# Patient Record
Sex: Male | Born: 1975 | Race: White | Hispanic: No | Marital: Single | Smoking: Never smoker
Health system: Southern US, Community
[De-identification: ages and names within clinical notes are randomized; demographics above are authoritative.]

## PROBLEM LIST (undated history)

## (undated) DIAGNOSIS — F419 Anxiety disorder, unspecified: Secondary | ICD-10-CM

---

## 2008-05-18 ENCOUNTER — Emergency Department (HOSPITAL_COMMUNITY): Admission: EM | Admit: 2008-05-18 | Discharge: 2008-05-18 | Payer: Self-pay | Admitting: Emergency Medicine

## 2008-05-21 ENCOUNTER — Emergency Department (HOSPITAL_COMMUNITY): Admission: EM | Admit: 2008-05-21 | Discharge: 2008-05-21 | Payer: Self-pay | Admitting: Emergency Medicine

## 2011-07-29 LAB — CULTURE, ROUTINE-ABSCESS

## 2016-02-09 ENCOUNTER — Other Ambulatory Visit (HOSPITAL_COMMUNITY): Payer: Self-pay | Admitting: Family

## 2016-02-09 DIAGNOSIS — R609 Edema, unspecified: Secondary | ICD-10-CM

## 2016-02-09 DIAGNOSIS — R52 Pain, unspecified: Secondary | ICD-10-CM

## 2016-02-10 ENCOUNTER — Ambulatory Visit (HOSPITAL_COMMUNITY)
Admission: RE | Admit: 2016-02-10 | Discharge: 2016-02-10 | Disposition: A | Discharge status: Home / Self Care | Payer: Self-pay | Source: Ambulatory Visit | Attending: Family | Admitting: Family

## 2016-02-10 ENCOUNTER — Ambulatory Visit (HOSPITAL_COMMUNITY): Admission: RE | Admit: 2016-02-10 | Payer: Self-pay | Source: Ambulatory Visit

## 2016-02-10 DIAGNOSIS — R938 Abnormal findings on diagnostic imaging of other specified body structures: Secondary | ICD-10-CM | POA: Insufficient documentation

## 2016-02-10 DIAGNOSIS — R52 Pain, unspecified: Secondary | ICD-10-CM

## 2016-02-10 DIAGNOSIS — N433 Hydrocele, unspecified: Secondary | ICD-10-CM | POA: Insufficient documentation

## 2016-02-10 DIAGNOSIS — N50812 Left testicular pain: Secondary | ICD-10-CM | POA: Insufficient documentation

## 2016-02-10 DIAGNOSIS — R609 Edema, unspecified: Secondary | ICD-10-CM

## 2016-03-16 ENCOUNTER — Ambulatory Visit (INDEPENDENT_AMBULATORY_CARE_PROVIDER_SITE_OTHER): Payer: Self-pay | Admitting: Urology

## 2016-03-16 DIAGNOSIS — N433 Hydrocele, unspecified: Secondary | ICD-10-CM

## 2016-03-17 ENCOUNTER — Other Ambulatory Visit: Payer: Self-pay

## 2016-03-17 DIAGNOSIS — N433 Hydrocele, unspecified: Secondary | ICD-10-CM

## 2016-03-18 ENCOUNTER — Telehealth: Payer: Self-pay | Admitting: Urology

## 2016-03-18 NOTE — Telephone Encounter (Signed)
Pt advised of pre op date/time and sx date. Pre op: 04/08/16 @ 9:00am. Sx: 04/13/16 12-1:00 pm--with Dr Ronne BinningMcKenzie @ [facility]. Post op: 04/20/16 @ 3:15pm.  No authorization--patient is self pay.

## 2016-04-08 ENCOUNTER — Encounter (HOSPITAL_COMMUNITY)
Admission: RE | Admit: 2016-04-08 | Discharge: 2016-04-08 | Disposition: A | Discharge status: Home / Self Care | Payer: Self-pay | Source: Ambulatory Visit | Attending: Urology | Admitting: Urology

## 2016-04-08 ENCOUNTER — Encounter (HOSPITAL_COMMUNITY): Payer: Self-pay

## 2016-04-08 DIAGNOSIS — Z01810 Encounter for preprocedural cardiovascular examination: Secondary | ICD-10-CM | POA: Insufficient documentation

## 2016-04-08 DIAGNOSIS — Z01812 Encounter for preprocedural laboratory examination: Secondary | ICD-10-CM | POA: Insufficient documentation

## 2016-04-08 HISTORY — DX: Anxiety disorder, unspecified: F41.9

## 2016-04-08 LAB — SURGICAL PCR SCREEN
MRSA, PCR: NEGATIVE
Staphylococcus aureus: NEGATIVE

## 2016-04-08 LAB — CBC WITH DIFFERENTIAL/PLATELET
BASOS ABS: 0 10*3/uL (ref 0.0–0.1)
Basophils Relative: 1 %
EOS ABS: 0.3 10*3/uL (ref 0.0–0.7)
EOS PCT: 4 %
HCT: 42.9 % (ref 39.0–52.0)
Hemoglobin: 14.2 g/dL (ref 13.0–17.0)
Lymphocytes Relative: 21 %
Lymphs Abs: 1.7 10*3/uL (ref 0.7–4.0)
MCH: 29.3 pg (ref 26.0–34.0)
MCHC: 33.1 g/dL (ref 30.0–36.0)
MCV: 88.6 fL (ref 78.0–100.0)
Monocytes Absolute: 0.6 10*3/uL (ref 0.1–1.0)
Monocytes Relative: 7 %
Neutro Abs: 5.5 10*3/uL (ref 1.7–7.7)
Neutrophils Relative %: 67 %
PLATELETS: 366 10*3/uL (ref 150–400)
RBC: 4.84 MIL/uL (ref 4.22–5.81)
RDW: 12.5 % (ref 11.5–15.5)
WBC: 8.2 10*3/uL (ref 4.0–10.5)

## 2016-04-08 NOTE — Pre-Procedure Instructions (Signed)
Patient given information to sign up for my chart at home. 

## 2016-04-08 NOTE — Patient Instructions (Signed)
[facility]  04/08/2016     @    Your procedure is scheduled on:  04/13/2016  Report to Day Surgery at 1000 AM.                        Remember:             Do not eat food or drink liquids after midnight.   Take these medicines the morning of surgery with A SIP OF WATER: none  Do not wear jewelry, make-up or nail polish.  Do not wear lotions, powders, or perfumes. You may wear deodorant.  Do not shave 48 hours prior to surgery. Men may shave face and neck.  Do not bring valuables to the hospital.               [facility] is not responsible for any belongings or valuables.               Contacts, dentures or bridgework may not be worn into surgery.  Leave suitcase in the car. After surgery it may be brought to your room.                          For patients admitted to the hospital, discharge time              is determined by your treatment team.               Patients discharged the day of surgery will not be allowed to drive home.               Name and phone number of your driver: Mom  Special Instructions: None   Please read over the following fact sheets that you were given:              Hydrocelectomy Hydrocelectomy is a procedure to drain a hydrocele, which is a sac where fluid has built up around the testicles. The procedure may be done if a hydrocele is causing painful swelling in the pouch that holds the testicles (scrotum). LET [facility] CARE PROVIDER KNOW ABOUT:  Any allergies you have.  All medicines you are taking, including vitamins, herbs, eye drops, creams, and over-the-counter medicines.  Previous problems you or members of your family have had with the use of anesthetics.  Any blood disorders you have.  Previous surgeries you have had.  Any medical conditions you have. RISKS AND COMPLICATIONS Generally this is a safe procedure. However, problems may occur,  including:  Bleeding.  Bleeding into your scrotum (scrotal hematoma).  Damage to your testicle or the tube that carries sperm out of your testicle (vas deferens).  Infection.  Allergic reactions to medicines. BEFORE THE PROCEDURE  Ask your health care provider about:  Changing or stopping your regular medicines. This is especially important if you are taking diabetes medicines or blood thinners.  Taking medicines such as aspirin and ibuprofen. These medicines can thin your blood. Do not take these medicines before your procedure if your health care provider instructs you not to.  Follow instructions from your health care provider about eating or drinking restrictions.  Plan to have someone take you home after the procedure.  Ask your health care provider how your surgical site will be marked or identified.  You may be given antibiotic  medicine to help prevent infection. PROCEDURE  To reduce your risk of infection:  Your health care team will wash or sanitize their hands.  Your scrotum and the area around it will be shaved and washed with soap.  An IV tube will be inserted into one of your veins.  You will be given one or more of the following:  A medicine to make you relax (sedative).  A medicine to make you fall asleep (general anesthetic).  A small cut (incision) will be made through the skin of your scrotum.  Your testicle and the hydrocele will be located, and the hydrocele sac will be opened with an incision.  The fluid will be drained from the sac.  The sac will be closed with absorbable stitches (sutures) to prevent fluid from building up again.  If you have a large hydrocele, a thin rubber drain may be placed to allow fluid to drain after the procedure.  The incision in your scrotum will be closed with absorbable sutures.  A bandage (dressing) will be placed on your scrotum. The procedure may vary among health care providers and hospitals. AFTER THE  PROCEDURE  Your blood pressure, heart rate, breathing rate, and blood oxygen level will be monitored often until the medicines you were given have worn off.  You will be given pain medicine as needed.  Do not drive for 24 hours if you received a sedative.  You may have to wear an athletic support strap to hold the dressing in place and support your scrotum.   This information is not intended to replace advice given to you by your health care provider. Make sure you discuss any questions you have with your health care provider.   Document Released: 07/08/2015 Document Reviewed: 04/15/2015 Elsevier Interactive Patient Education 2016 [employer]. Hydrocelectomy, Care After Refer to this sheet in the next few weeks. These instructions provide you with information about caring for yourself after your procedure. Your health care provider may also give you more specific instructions. Your treatment has been planned according to current medical practices, but problems sometimes occur. Call your health care provider if you have any problems or questions after your procedure. WHAT TO EXPECT AFTER THE PROCEDURE After your procedure, it is common for the pouch that holds your testicles (scrotum) to be painful, swollen, and bruised. HOME CARE INSTRUCTIONS Bathing  Ask your health care provider when you can shower, take baths, or go swimming.  If you were told to wear an athletic support strap, take it off when you shower or take a bath. Incision Care  Follow instructions from your health care provider about how to take care of your incision. Make sure you:  Wash your hands with soap and water before you change your bandage (dressing). If soap and water are not available, use hand sanitizer.  Change your dressing as told by your health care provider.  Leave stitches (sutures) in place.  Check your incision and scrotum every day for signs of infection. Check for:  More redness, swelling, or  pain.  Blood or fluid.  Warmth.  Pus or a bad smell. Managing Pain, Stiffness, and Swelling  If directed, apply ice to the injured area:  Put ice in a plastic bag.  Place a towel between your skin and the bag.  Leave the ice on for 20 minutes, 2-3 times per day. Driving  Do not drive for 24 hours if you received a sedative.  Do not drive or operate heavy machinery while  taking prescription pain medicine.  Ask your health care provider when it is safe to drive. Activity  Do not do any activities that require great strength and energy (are vigorous) for as long as told by your health care provider.  Return to your normal activities as told by your health care provider. Ask your health care provider what activities are safe for you.  Do not lift anything that is heavier than 10 lb (4.5 kg) until your health care provider says that it is safe. General Instructions  Take over-the-counter and prescription medicines only as told by your health care provider.  Keep all follow-up visits as told by your health care provider. This is important.  If you were given an athletic support strap, wear it as told by your health care provider.  If you had a drain put in during the procedure, you will need to return to have it removed. SEEK MEDICAL CARE IF:  Your pain gets worse.  You have more redness, swelling, or pain around your scrotum.  You have blood or fluid coming from your scrotum.  Your incision feels warm to the touch.  You have pus or a bad smell coming from your scrotum.  You have a fever.   This information is not intended to replace advice given to you by your health care provider. Make sure you discuss any questions you have with your health care provider.   Document Released: 07/08/2015 Document Reviewed: 04/15/2015 Elsevier Interactive Patient Education 2016 [employer] General Anesthesia, Adult, Care After Refer to this sheet in the next few weeks. These  instructions provide you with information on caring for yourself after your procedure. Your health care provider may also give you more specific instructions. Your treatment has been planned according to current medical practices, but problems sometimes occur. Call your health care provider if you have any problems or questions after your procedure. WHAT TO EXPECT AFTER THE PROCEDURE After the procedure, it is typical to experience:  Sleepiness.  Nausea and vomiting. HOME CARE INSTRUCTIONS  For the first 24 hours after general anesthesia:  Have a responsible person with you.  Do not drive a car. If you are alone, do not take public transportation.  Do not drink alcohol.  Do not take medicine that has not been prescribed by your health care provider.  Do not sign important papers or make important decisions.  You may resume a normal diet and activities as directed by your health care provider.  Change bandages (dressings) as directed.  If you have questions or problems that seem related to general anesthesia, call the hospital and ask for the anesthetist or anesthesiologist on call. SEEK MEDICAL CARE IF:  You have nausea and vomiting that continue the day after anesthesia.  You develop a rash. SEEK IMMEDIATE MEDICAL CARE IF:   You have difficulty breathing.  You have chest pain.  You have any allergic problems.   This information is not intended to replace advice given to you by your health care provider. Make sure you discuss any questions you have with your health care provider.   Document Released: 01/23/2001 Document Revised: 11/07/2014 Document Reviewed: 02/15/2012 Elsevier Interactive Patient Education 2016 [employer] General Anesthesia, Adult General anesthesia is a sleep-like state of non-feeling produced by medicines (anesthetics). General anesthesia prevents you from being alert and feeling pain during a medical procedure. Your caregiver may recommend general  anesthesia if your procedure:  Is long.  Is painful or uncomfortable.  Would be frightening  to see or hear.  Requires you to be still.  Affects your breathing.  Causes significant blood loss. LET YOUR CAREGIVER KNOW ABOUT:  Allergies to food or medicine.  Medicines taken, including vitamins, herbs, eyedrops, over-the-counter medicines, and creams.  Use of steroids (by mouth or creams).  Previous problems with anesthetics or numbing medicines, including problems experienced by relatives.  History of bleeding problems or blood clots.  Previous surgeries and types of anesthetics received.  Possibility of pregnancy, if this applies.  Use of cigarettes, alcohol, or illegal drugs.  Any health condition(s), especially diabetes, sleep apnea, and high blood pressure. RISKS AND COMPLICATIONS General anesthesia rarely causes complications. However, if complications do occur, they can be life threatening. Complications include:  A lung infection.  A stroke.  A heart attack.  Waking up during the procedure. When this occurs, the patient may be unable to move and communicate that he or she is awake. The patient may feel severe pain. Older adults and adults with serious medical problems are more likely to have complications than adults who are young and healthy. Some complications can be prevented by answering all of your caregiver's questions thoroughly and by following all pre-procedure instructions. It is important to tell your caregiver if any of the pre-procedure instructions, especially those related to diet, were not followed. Any food or liquid in the stomach can cause problems when you are under general anesthesia. BEFORE THE PROCEDURE  Ask your caregiver if you will have to spend the night at the hospital. If you will not have to spend the night, arrange to have an adult drive you and stay with you for 24 hours.  Follow your caregiver's instructions if you are taking dietary  supplements or medicines. Your caregiver may tell you to stop taking them or to reduce your dosage.  Do not smoke for as long as possible before your procedure. If possible, stop smoking 3-6 weeks before the procedure.  Do not take new dietary supplements or medicines within 1 week of your procedure unless your caregiver approves them.  Do not eat within 8 hours of your procedure or as directed by your caregiver. Drink only clear liquids, such as water, black coffee (without milk or cream), and fruit juices (without pulp).  Do not drink within 3 hours of your procedure or as directed by your caregiver.  You may brush your teeth on the morning of the procedure, but make sure to spit out the toothpaste and water when finished. PROCEDURE  You will receive anesthetics through a mask, through an intravenous (IV) access tube, or through both. A doctor who specializes in anesthesia (anesthesiologist) or a nurse who specializes in anesthesia (nurse anesthetist) or both will stay with you throughout the procedure to make sure you remain unconscious. He or she will also watch your blood pressure, pulse, and oxygen levels to make sure that the anesthetics do not cause any problems. Once you are asleep, a breathing tube or mask may be used to help you breathe. AFTER THE PROCEDURE You will wake up after the procedure is complete. You may be in the room where the procedure was performed or in a recovery area. You may have a sore throat if a breathing tube was used. You may also feel:  Dizzy.  Weak.  Drowsy.  Confused.  Nauseous.  Cold. These are all normal responses and can be expected to last for up to 24 hours after the procedure is complete. A caregiver will tell you when you  are ready to go home. This will usually be when you are fully awake and in stable condition.   This information is not intended to replace advice given to you by your health care provider. Make sure you discuss any questions  you have with your health care provider.   Document Released: 01/24/2008 Document Revised: 11/07/2014 Document Reviewed: 02/15/2012 Elsevier Interactive Patient Education 2016 Elsevier Inc. PATIENT INSTRUCTIONS POST-ANESTHESIA  IMMEDIATELY FOLLOWING SURGERY:  Do not drive or operate machinery for the first twenty four hours after surgery.  Do not make any important decisions for twenty four hours after surgery or while taking narcotic pain medications or sedatives.  If you develop intractable nausea and vomiting or a severe headache please notify your doctor immediately.  FOLLOW-UP:  Please make an appointment with your surgeon as instructed. You do not need to follow up with anesthesia unless specifically instructed to do so.  WOUND CARE INSTRUCTIONS (if applicable):  Keep a dry clean dressing on the anesthesia/puncture wound site if there is drainage.  Once the wound has quit draining you may leave it open to air.  Generally you should leave the bandage intact for twenty four hours unless there is drainage.  If the epidural site drains for more than 36-48 hours please call the anesthesia department.  QUESTIONS?:  Please feel free to call your physician or the hospital operator if you have any questions, and they will be happy to assist you.

## 2016-04-13 ENCOUNTER — Ambulatory Visit (HOSPITAL_COMMUNITY): Payer: Self-pay | Admitting: Anesthesiology

## 2016-04-13 ENCOUNTER — Encounter (HOSPITAL_COMMUNITY)
Admission: RE | Disposition: A | Discharge status: Home / Self Care | Payer: Self-pay | Source: Ambulatory Visit | Attending: Urology

## 2016-04-13 ENCOUNTER — Ambulatory Visit (HOSPITAL_COMMUNITY)
Admission: RE | Admit: 2016-04-13 | Discharge: 2016-04-13 | Disposition: A | Discharge status: Home / Self Care | Payer: Self-pay | Source: Ambulatory Visit | Attending: Urology | Admitting: Urology

## 2016-04-13 ENCOUNTER — Encounter (HOSPITAL_COMMUNITY): Payer: Self-pay | Admitting: Anesthesiology

## 2016-04-13 DIAGNOSIS — N50812 Left testicular pain: Secondary | ICD-10-CM | POA: Insufficient documentation

## 2016-04-13 DIAGNOSIS — N431 Infected hydrocele: Secondary | ICD-10-CM

## 2016-04-13 DIAGNOSIS — N433 Hydrocele, unspecified: Secondary | ICD-10-CM | POA: Insufficient documentation

## 2016-04-13 DIAGNOSIS — F419 Anxiety disorder, unspecified: Secondary | ICD-10-CM | POA: Insufficient documentation

## 2016-04-13 HISTORY — PX: HYDROCELE EXCISION: SHX482

## 2016-04-13 SURGERY — HYDROCELECTOMY
Anesthesia: General | Laterality: Left

## 2016-04-13 MED ORDER — FENTANYL CITRATE (PF) 100 MCG/2ML IJ SOLN
INTRAMUSCULAR | Status: AC
  Filled 2016-04-13: qty 2

## 2016-04-13 MED ORDER — FENTANYL CITRATE (PF) 100 MCG/2ML IJ SOLN
25.0000 ug | INTRAMUSCULAR | Status: DC | PRN
  Administered 2016-04-13: 25 ug via INTRAVENOUS
  Administered 2016-04-13: 50 ug via INTRAVENOUS
  Administered 2016-04-13 (×3): 25 ug via INTRAVENOUS
  Filled 2016-04-13 (×2): qty 2

## 2016-04-13 MED ORDER — FENTANYL CITRATE (PF) 100 MCG/2ML IJ SOLN
INTRAMUSCULAR | Status: DC | PRN
  Administered 2016-04-13: 25 ug via INTRAVENOUS
  Administered 2016-04-13 (×3): 50 ug via INTRAVENOUS
  Administered 2016-04-13: 25 ug via INTRAVENOUS

## 2016-04-13 MED ORDER — PROPOFOL 10 MG/ML IV BOLUS
INTRAVENOUS | Status: DC | PRN
  Administered 2016-04-13: 50 mg via INTRAVENOUS
  Administered 2016-04-13: 200 mg via INTRAVENOUS

## 2016-04-13 MED ORDER — BUPIVACAINE HCL (PF) 0.25 % IJ SOLN
INTRAMUSCULAR | Status: DC | PRN
  Administered 2016-04-13: 10 mL

## 2016-04-13 MED ORDER — OXYCODONE-ACETAMINOPHEN 5-325 MG PO TABS: 1.0000 | ORAL_TABLET | ORAL | Status: AC | PRN

## 2016-04-13 MED ORDER — LIDOCAINE HCL (PF) 1 % IJ SOLN
INTRAMUSCULAR | Status: AC
  Filled 2016-04-13: qty 5

## 2016-04-13 MED ORDER — ONDANSETRON HCL 4 MG/2ML IJ SOLN
4.0000 mg | Freq: Once | INTRAMUSCULAR | Status: AC
  Administered 2016-04-13: 4 mg via INTRAVENOUS
  Filled 2016-04-13: qty 2

## 2016-04-13 MED ORDER — PROPOFOL 10 MG/ML IV BOLUS
INTRAVENOUS | Status: AC
  Filled 2016-04-13: qty 20

## 2016-04-13 MED ORDER — SULFAMETHOXAZOLE-TRIMETHOPRIM 800-160 MG PO TABS: 1.0000 | ORAL_TABLET | Freq: Two times a day (BID) | ORAL | Status: DC

## 2016-04-13 MED ORDER — CEFAZOLIN SODIUM 1-5 GM-% IV SOLN
1.0000 g | INTRAVENOUS | Status: AC
  Administered 2016-04-13: 1 g via INTRAVENOUS
  Filled 2016-04-13: qty 50

## 2016-04-13 MED ORDER — BUPIVACAINE HCL (PF) 0.25 % IJ SOLN
INTRAMUSCULAR | Status: AC
  Filled 2016-04-13: qty 30

## 2016-04-13 MED ORDER — MIDAZOLAM HCL 2 MG/2ML IJ SOLN
1.0000 mg | INTRAMUSCULAR | Status: DC | PRN
  Administered 2016-04-13: 2 mg via INTRAVENOUS
  Administered 2016-04-13 (×2): 1 mg via INTRAVENOUS
  Filled 2016-04-13 (×2): qty 2

## 2016-04-13 MED ORDER — LIDOCAINE HCL (CARDIAC) 10 MG/ML IV SOLN
INTRAVENOUS | Status: DC | PRN
  Administered 2016-04-13: 50 mg via INTRAVENOUS

## 2016-04-13 MED ORDER — ONDANSETRON HCL 4 MG/2ML IJ SOLN: 4.0000 mg | Freq: Once | INTRAMUSCULAR | Status: DC | PRN

## 2016-04-13 MED ORDER — FENTANYL CITRATE (PF) 100 MCG/2ML IJ SOLN
25.0000 ug | INTRAMUSCULAR | Status: AC
  Administered 2016-04-13 (×2): 25 ug via INTRAVENOUS
  Filled 2016-04-13: qty 2

## 2016-04-13 MED ORDER — LACTATED RINGERS IV SOLN
INTRAVENOUS | Status: DC
  Administered 2016-04-13: 12:00:00 via INTRAVENOUS

## 2016-04-13 SURGICAL SUPPLY — 48 items
APPLICATOR COTTON TIP 6IN STRL (MISCELLANEOUS) IMPLANT
BAG HAMPER (MISCELLANEOUS) ×3 IMPLANT
BAG URINE DRAINAGE (UROLOGICAL SUPPLIES) IMPLANT
BLADE SURG 15 STRL LF DISP TIS (BLADE) ×1 IMPLANT
BLADE SURG 15 STRL SS (BLADE) ×2
BNDG GAUZE ELAST 4 BULKY (GAUZE/BANDAGES/DRESSINGS) ×3 IMPLANT
CATH FOLEY 2WAY SLVR  5CC 16FR (CATHETERS)
CATH FOLEY 2WAY SLVR 5CC 16FR (CATHETERS) IMPLANT
DRAIN PENROSE 18X1/2 LTX STRL (DRAIN) ×6 IMPLANT
DRSG TEGADERM 4X4.75 (GAUZE/BANDAGES/DRESSINGS) IMPLANT
ELECT NEEDLE BLADE 2-5/6 (NEEDLE) IMPLANT
ELECT REM PT RETURN 9FT ADLT (ELECTROSURGICAL) ×3
ELECTRODE REM PT RTRN 9FT ADLT (ELECTROSURGICAL) ×1 IMPLANT
GAUZE FLUFF 18X24 1PLY STRL (GAUZE/BANDAGES/DRESSINGS) ×3 IMPLANT
GAUZE SPONGE 4X4 16PLY XRAY LF (GAUZE/BANDAGES/DRESSINGS) ×6 IMPLANT
GLOVE BIO SURGEON STRL SZ8 (GLOVE) ×3 IMPLANT
GLOVE BIOGEL M 7.0 STRL (GLOVE) ×6 IMPLANT
GLOVE BIOGEL PI IND STRL 6.5 (GLOVE) ×1 IMPLANT
GLOVE BIOGEL PI IND STRL 7.0 (GLOVE) ×2 IMPLANT
GLOVE BIOGEL PI INDICATOR 6.5 (GLOVE) ×2
GLOVE BIOGEL PI INDICATOR 7.0 (GLOVE) ×4
GOWN STRL REUS W/ TWL LRG LVL3 (GOWN DISPOSABLE) ×1 IMPLANT
GOWN STRL REUS W/ TWL XL LVL3 (GOWN DISPOSABLE) ×1 IMPLANT
GOWN STRL REUS W/TWL LRG LVL3 (GOWN DISPOSABLE) ×2
GOWN STRL REUS W/TWL XL LVL3 (GOWN DISPOSABLE) ×2
KIT ROOM TURNOVER APOR (KITS) ×3 IMPLANT
LIQUID BAND (GAUZE/BANDAGES/DRESSINGS) ×3 IMPLANT
MANIFOLD NEPTUNE II (INSTRUMENTS) ×3 IMPLANT
NEEDLE HYPO 25X1 1.5 SAFETY (NEEDLE) ×3 IMPLANT
NS IRRIG 1000ML POUR BTL (IV SOLUTION) ×3 IMPLANT
NS IRRIG 500ML POUR BTL (IV SOLUTION) IMPLANT
PACK MINOR (CUSTOM PROCEDURE TRAY) ×3 IMPLANT
PAD ARMBOARD 7.5X6 YLW CONV (MISCELLANEOUS) ×3 IMPLANT
SET BASIN LINEN APH (SET/KITS/TRAYS/PACK) ×3 IMPLANT
SUPPORT SCROTAL LG STRP (MISCELLANEOUS) ×4 IMPLANT
SUPPORTER ATHLETIC LG (MISCELLANEOUS) ×2
SUT ETHILON 4 0 PS 2 18 (SUTURE) ×3 IMPLANT
SUT MNCRL AB 4-0 PS2 18 (SUTURE) ×3 IMPLANT
SUT SILK 0 FSL (SUTURE) IMPLANT
SUT SILK 3 0 (SUTURE) ×2
SUT SILK 3-0 18XBRD TIE 12 (SUTURE) ×1 IMPLANT
SUT VIC AB 2-0 SH 27 (SUTURE) ×4
SUT VIC AB 2-0 SH 27XBRD (SUTURE) ×2 IMPLANT
SYR 30ML LL (SYRINGE) IMPLANT
SYR CONTROL 10ML LL (SYRINGE) ×3 IMPLANT
TOWEL OR 17X24 6PK STRL BLUE (TOWEL DISPOSABLE) ×6 IMPLANT
WATER STERILE IRR 500ML POUR (IV SOLUTION) IMPLANT
YANKAUER SUCT BULB TIP NO VENT (SUCTIONS) IMPLANT

## 2016-04-13 NOTE — Discharge Instructions (Signed)
PATIENT INSTRUCTIONS POST-ANESTHESIA  IMMEDIATELY FOLLOWING SURGERY:  Do not drive or operate machinery for the first twenty four hours after surgery.  Do not make any important decisions for twenty four hours after surgery or while taking narcotic pain medications or sedatives.  If you develop intractable nausea and vomiting or a severe headache please notify your doctor immediately.  FOLLOW-UP:  Please make an appointment with your surgeon as instructed. You do not need to follow up with anesthesia unless specifically instructed to do so.  WOUND CARE INSTRUCTIONS (if applicable):  Keep a dry clean dressing on the anesthesia/puncture wound site if there is drainage.  Once the wound has quit draining you may leave it open to air.  Generally you should leave the bandage intact for twenty four hours unless there is drainage.  If the epidural site drains for more than 36-48 hours please call the anesthesia department.  QUESTIONS?:  Please feel free to call your physician or the hospital operator if you have any questions, and they will be happy to assist you.       Hydrocelectomy, Care After Refer to this sheet in the next few weeks. These instructions provide you with information about caring for yourself after your procedure. Your health care provider may also give you more specific instructions. Your treatment has been planned according to current medical practices, but problems sometimes occur. Call your health care provider if you have any problems or questions after your procedure. WHAT TO EXPECT AFTER THE PROCEDURE After your procedure, it is common for the pouch that holds your testicles (scrotum) to be painful, swollen, and bruised. HOME CARE INSTRUCTIONS Bathing  Ask your health care provider when you can shower, take baths, or go swimming.  If you were told to wear an athletic support strap, take it off when you shower or take a bath. Incision Care  Follow instructions from your  health care provider about how to take care of your incision. Make sure you:  Wash your hands with soap and water before you change your bandage (dressing). If soap and water are not available, use hand sanitizer.  Change your dressing as told by your health care provider.  Leave stitches (sutures) in place.  Check your incision and scrotum every day for signs of infection. Check for:  More redness, swelling, or pain.  Blood or fluid.  Warmth.  Pus or a bad smell. Managing Pain, Stiffness, and Swelling  If directed, apply ice to the injured area:  Put ice in a plastic bag.  Place a towel between your skin and the bag.  Leave the ice on for 20 minutes, 2-3 times per day. Driving  Do not drive for 24 hours if you received a sedative.  Do not drive or operate heavy machinery while taking prescription pain medicine.  Ask your health care provider when it is safe to drive. Activity  Do not do any activities that require great strength and energy (are vigorous) for as long as told by your health care provider.  Return to your normal activities as told by your health care provider. Ask your health care provider what activities are safe for you.  Do not lift anything that is heavier than 10 lb (4.5 kg) until your health care provider says that it is safe. General Instructions  Take over-the-counter and prescription medicines only as told by your health care provider.  Keep all follow-up visits as told by your health care provider. This is important.  If you  were given an athletic support strap, wear it as told by your health care provider.  If you had a drain put in during the procedure, you will need to return to have it removed. SEEK MEDICAL CARE IF:  Your pain gets worse.  You have more redness, swelling, or pain around your scrotum.  You have blood or fluid coming from your scrotum.  Your incision feels warm to the touch.  You have pus or a bad smell coming from  your scrotum.  You have a fever.   This information is not intended to replace advice given to you by your health care provider. Make sure you discuss any questions you have with your health care provider.   Document Released: 07/08/2015 Document Reviewed: 04/15/2015 Elsevier Interactive Patient Education 2016 [employer]   Bulb Drain Home Care A bulb drain consists of a thin rubber tube and a soft, round bulb that creates a gentle suction. The rubber tube is placed in the area where you had surgery. A bulb is attached to the end of the tube that is outside the body. The bulb drain removes excess fluid that normally builds up in a surgical wound after surgery. The color and amount of fluid will vary. Immediately after surgery, the fluid is bright red and is a little thicker than water. It may gradually change to a yellow or pink color and become more thin and water-like. When the amount decreases to about 1 or 2 tbsp in 24 hours, your health care provider will usually remove it. DAILY CARE  Keep the bulb flat (compressed) at all times, except while emptying it. The flatness creates suction. You can flatten the bulb by squeezing it firmly in the middle and then closing the cap.  Keep sites where the tube enters the skin dry and covered with a bandage (dressing).  Secure the tube 1-2 in (2.5-5.1 cm) below the insertion sites to keep it from pulling on your stitches. The tube is stitched in place and will not slip out.  Secure the bulb as directed by your health care provider.  For the first 3 days after surgery, there usually is more fluid in the bulb. Empty the bulb whenever it becomes half full because the bulb does not create enough suction if it is too full. The bulb could also overflow. Write down how much fluid you remove each time you empty your drain. Add up the amount removed in 24 hours.  Empty the bulb at the same time every day once the amount of fluid decreases and you only need  to empty it once a day. Write down the amounts and the 24-hour totals to give to your health care provider. This helps your health care provider know when the tubes can be removed. EMPTYING THE BULB DRAIN Before emptying the bulb, get a measuring cup, a piece of paper and a pen, and wash your hands.  Gently run your fingers down the tube (stripping) to empty any drainage from the tubing into the bulb. This may need to be done several times a day to clear the tubing of clots and tissue.  Open the bulb cap to release suction, which causes it to inflate. Do not touch the inside of the cap.  Gently run your fingers down the tube (stripping) to empty any drainage from the tubing into the bulb.  Hold the cap out of the way, and pour fluid into the measuring cup.   Squeeze the bulb to provide suction.  Replace the cap.   Check the tape that holds the tube to your skin. If it is becoming loose, you can remove the loose piece of tape and apply a new one. Then, pin the bulb to your shirt.   Write down the amount of fluid you emptied out. Write down the date and each time you emptied your bulb drain. (If there are 2 bulbs, note the amount of drainage from each bulb and keep the totals separate. Your health care provider will want to know the total amounts for each drain and which tube is draining more.)   Flush the fluid down the toilet and wash your hands.   Call your health care provider once you have less than 2 tbsp of fluid collecting in the bulb drain every 24 hours. If there is drainage around the tube site, change dressings and keep the area dry. Cleanse around tube with sterile saline and place dry gauze around site. This gauze should be changed when it is soiled. If it stays clean and unsoiled, it should still be changed daily.  SEEK MEDICAL CARE IF:  Your drainage has a bad smell or is cloudy.   You have a fever.   Your drainage is increasing instead of decreasing.   Your  tube fell out.   You have redness or swelling around the tube site.   You have drainage from a surgical wound.   Your bulb drain will not stay flat after you empty it.  MAKE SURE YOU:   Understand these instructions.  Will watch your condition.  Will get help right away if you are not doing well or get worse.   This information is not intended to replace advice given to you by your health care provider. Make sure you discuss any questions you have with your health care provider.   Document Released: 10/14/2000 Document Revised: 11/07/2014 Document Reviewed: 05/06/2015 Elsevier Interactive Patient Education [employer]

## 2016-04-13 NOTE — Transfer of Care (Signed)
Immediate Anesthesia Transfer of Care Note  [facility]  Procedure(s) Performed: Procedure(s): HYDROCELECTOMY ADULT (Left)  Patient Location: PACU  Anesthesia Type:General  Level of Consciousness: awake and patient cooperative  Airway & Oxygen Therapy: Patient Spontanous Breathing and Patient connected to nasal cannula oxygen  Post-op Assessment: Report given to RN and Patient moving all extremities  Post vital signs: Reviewed and stable    Last Pain: There were no vitals filed for this visit.       Complications: No apparent anesthesia complications

## 2016-04-13 NOTE — Op Note (Signed)
Preoperative diagnosis: Left epididymal cyst  Postoperative diagnosis:  left infected hydrocele  Procedure: 1. Excision of left appendix testis 2. Left hydrocelectomy  Attending: Wilkie AyePatrick Jennea Rager, MD  Anesthesia: General  History of blood loss: Minimal  Antibiotics: ancef  Drains: left hemiscrotum penrose  Specimens: 1. Left hydrocele sac 2. Left appendix testis  Findings: infected hematocele, 1 small appendix testis  Indications: Patient is a 40 year old male with a history of left hydrocele that was growing in size and causing him pain with walking.  We discussed the treatment options including observation versus excision after discussing treatment options he and his mother decided to proceed with excision.   Procedure in detail: Prior to procedure consent was obtained.  Patient was brought to the operating room and a brief timeout was done to ensure correct patient, correct procedure, correct site.  General anesthesia was administered and patient was placed in supine position.  His genitalia was then prepped and draped in usual sterile fashion.  A 5 cm incision was made in the left hemiscrotum.  We dissected down to the tunica and then incised the tunica. A large hematocele was encountered. We then excised the hydrocele sac and then over sewed the edge with 2-0 Vicryl in a running fashion. We then excised the left appendix testis and sent it for pathology. Hemostasis was then obtained with electrocautery. We then closed the defect in the epididymis with 3-0 vicryl in a running fashion. We then returned the testis to the left hemiscrotum and closed the overlying dartos with 3-0 vicryl in a running fashion. The skin was then closed with 4-0 vicryl in a running fashion. Dermabond was placed on the incision.  A dressing was then applied to the incision.  We then placed a scrotal fluff and this then concluded the procedure which was well tolerated by the patient.  Complications:  None  Condition: Stable, extubated, transferred to PACU.  Plan: Patient is to be discharged home.  He is to follow up in 1 week for drain removal. He will be treated with 2 weeks of bactrim.

## 2016-04-13 NOTE — Anesthesia Procedure Notes (Signed)
Procedure Name: LMA Insertion Date/Time: 04/13/2016 12:53 PM Performed by: Franco NonesYATES, Tellis Spivak S Pre-anesthesia Checklist: Patient identified, Patient being monitored, Emergency Drugs available, Timeout performed and Suction available Patient Re-evaluated:Patient Re-evaluated prior to inductionOxygen Delivery Method: Circle System Utilized Preoxygenation: Pre-oxygenation with 100% oxygen Intubation Type: IV induction Ventilation: Mask ventilation without difficulty LMA: LMA inserted LMA Size: 5.0 Number of attempts: 1 Placement Confirmation: positive ETCO2 and breath sounds checked- equal and bilateral Tube secured with: Tape Dental Injury: Teeth and Oropharynx as per pre-operative assessment

## 2016-04-13 NOTE — Anesthesia Preprocedure Evaluation (Signed)
Anesthesia Evaluation  Patient identified by MRN, date of birth, ID band Patient awake    Reviewed: Allergy & Precautions, NPO status , Patient's Chart, lab work & pertinent test results  Airway Mallampati: II  TM Distance: >3 FB     Dental  (+) Teeth Intact, Dental Advisory Given   Pulmonary neg pulmonary ROS,    breath sounds clear to auscultation       Cardiovascular negative cardio ROS   Rhythm:Regular Rate:Normal     Neuro/Psych PSYCHIATRIC DISORDERS Anxiety    GI/Hepatic negative GI ROS,   Endo/Other    Renal/GU      Musculoskeletal   Abdominal   Peds  Hematology   Anesthesia Other Findings   Reproductive/Obstetrics                             Anesthesia Physical Anesthesia Plan  ASA: II  Anesthesia Plan: General   Post-op Pain Management:    Induction: Intravenous  Airway Management Planned: LMA  Additional Equipment:   Intra-op Plan:   Post-operative Plan: Extubation in OR  Informed Consent: I have reviewed the patients History and Physical, chart, labs and discussed the procedure including the risks, benefits and alternatives for the proposed anesthesia with the patient or authorized representative who has indicated his/her understanding and acceptance.     Plan Discussed with:   Anesthesia Plan Comments:         Anesthesia Quick Evaluation

## 2016-04-13 NOTE — Anesthesia Postprocedure Evaluation (Signed)
Anesthesia Post Note  [facility]  Procedure(s) Performed: Procedure(s) (LRB): HYDROCELECTOMY ADULT (Left)  Patient location during evaluation: PACU Anesthesia Type: General Level of consciousness: awake and alert Pain management: satisfactory to patient Vital Signs Assessment: post-procedure vital signs reviewed and stable Respiratory status: spontaneous breathing Cardiovascular status: stable Anesthetic complications: no    Last Vitals:  Filed Vitals:   04/13/16 1230 04/13/16 1415  BP: 128/85 94/55  Pulse:  87  Temp:  36.9 C  Resp: 16 15    Last Pain:  Filed Vitals:   04/13/16 1429  PainSc: 0-No pain                 Havoc Sanluis

## 2016-04-13 NOTE — H&P (Signed)
Urology Admission H&P  Chief Complaint: left scrotal swelling  History of Present Illness: Mr Samuel Wright is a 39yo with a large left hydrocele here today for definitive management. He has difficulty walking and mild left testicular pain with walking.  Past Medical History  Diagnosis Date  . Anxiety    History reviewed. No pertinent past surgical history.  Home Medications:  Prescriptions prior to admission  Medication Sig Dispense Refill Last Dose  . acetaminophen (TYLENOL) 500 MG tablet Take 1,000 mg by mouth every 6 (six) hours as needed for moderate pain or headache.     . Aspirin-Salicylamide-Caffeine (BC HEADACHE POWDER PO) Take 1 Package by mouth daily as needed (headache/pain).     . Multiple Vitamin (MULTIVITAMIN WITH MINERALS) TABS tablet Take 1 tablet by mouth daily.   04/12/2016 at 0800   Allergies: No Known Allergies  History reviewed. No pertinent family history. Social History:  reports that he has never smoked. He does not have any smokeless tobacco history on file. He reports that he does not drink alcohol or use illicit drugs.  Review of Systems  All other systems reviewed and are negative.   Physical Exam:  Vital signs in last 24 hours: Temp:  [97.6 F (36.4 C)] 97.6 F (36.4 C) (06/14 1042) Pulse Rate:  [72] 72 (06/14 1042) Resp:  [15-30] 24 (06/14 1215) BP: (125-134)/(82-94) 134/90 mmHg (06/14 1215) SpO2:  [93 %-99 %] 96 % (06/14 1215) Weight:  [83.915 kg (185 lb)] 83.915 kg (185 lb) (06/14 1042) Physical Exam  Constitutional: He is oriented to person, place, and time. He appears well-developed and well-nourished.  HENT:  Head: Normocephalic and atraumatic.  Eyes: EOM are normal. Pupils are equal, round, and reactive to light.  Neck: Normal range of motion. No thyromegaly present.  Cardiovascular: Normal rate and regular rhythm.   Respiratory: Effort normal. No respiratory distress.  GI: Soft. He exhibits no distension.  Musculoskeletal: Normal range  of motion.  Neurological: He is alert and oriented to person, place, and time.  Skin: Skin is warm and dry.  Psychiatric: He has a normal mood and affect. His behavior is normal. Judgment and thought content normal.    Laboratory Data:  No results found for this or any previous visit (from the past 24 hour(s)). Recent Results (from the past 240 hour(s))  Surgical pcr screen     Status: None   Collection Time: 04/08/16  9:20 AM  Result Value Ref Range Status   MRSA, PCR NEGATIVE NEGATIVE Final   Staphylococcus aureus NEGATIVE NEGATIVE Final    Comment:        The Xpert SA Assay (FDA approved for NASAL specimens in patients over 39 years of age), is one component of a comprehensive surveillance program.  Test performance has been validated by [facility] for patients greater than or equal to 39 year old. It is not intended to diagnose infection nor to guide or monitor treatment.    Creatinine: No results for input(s): CREATININE in the last 168 hours. Baseline Creatinine: unknwon  Impression/Assessment:  39yo with left hydrocele  Plan:  The risks/benefits/alternatives to left hydrocelectomy was explained to the patient and he understands and wishes to proceed with surgery  Samuel Wright 04/13/2016, 12:38 PM

## 2016-04-15 ENCOUNTER — Encounter (HOSPITAL_COMMUNITY): Payer: Self-pay | Admitting: Urology

## 2016-04-20 ENCOUNTER — Ambulatory Visit (INDEPENDENT_AMBULATORY_CARE_PROVIDER_SITE_OTHER): Payer: Self-pay | Admitting: Urology

## 2016-04-20 DIAGNOSIS — Z9889 Other specified postprocedural states: Secondary | ICD-10-CM

## 2016-05-18 ENCOUNTER — Ambulatory Visit (INDEPENDENT_AMBULATORY_CARE_PROVIDER_SITE_OTHER): Payer: Self-pay | Admitting: Urology

## 2016-05-18 DIAGNOSIS — Z9889 Other specified postprocedural states: Secondary | ICD-10-CM

## 2016-05-25 ENCOUNTER — Ambulatory Visit: Payer: Self-pay | Admitting: Urology

## 2016-07-06 ENCOUNTER — Ambulatory Visit: Payer: Self-pay | Admitting: Urology

## 2017-03-07 IMAGING — US US SCROTUM
1 series · 13 of 25 positions shown · non-contrast
Comparison: None.

CLINICAL DATA: Left testicular pain

EXAM:
ULTRASOUND OF SCROTUM
TECHNIQUE: Complete ultrasound examination of the testicles, epididymis, and
other scrotal structures was performed.

[Series 1: us scrotum · 0.09mm/px · 13 of 111 slices shown]
[im 1/111]
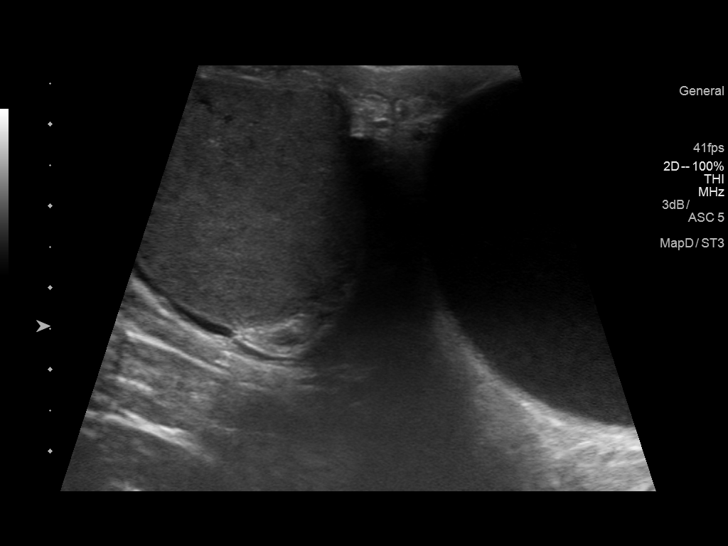
[im 10/111]
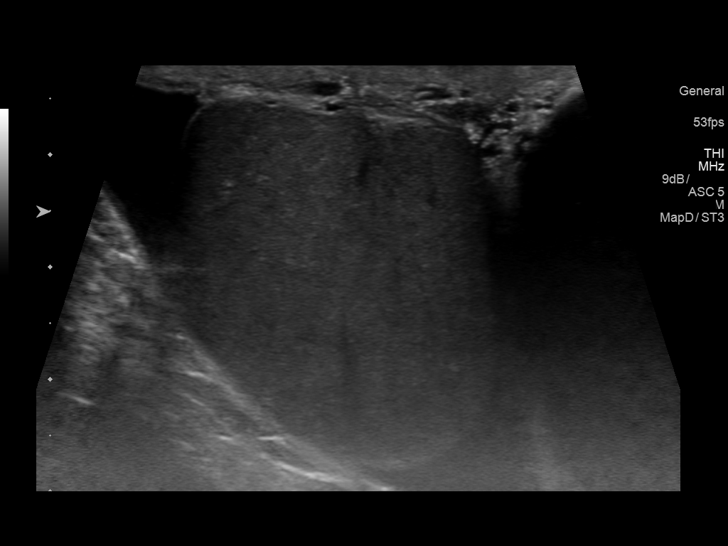
[im 19/111]
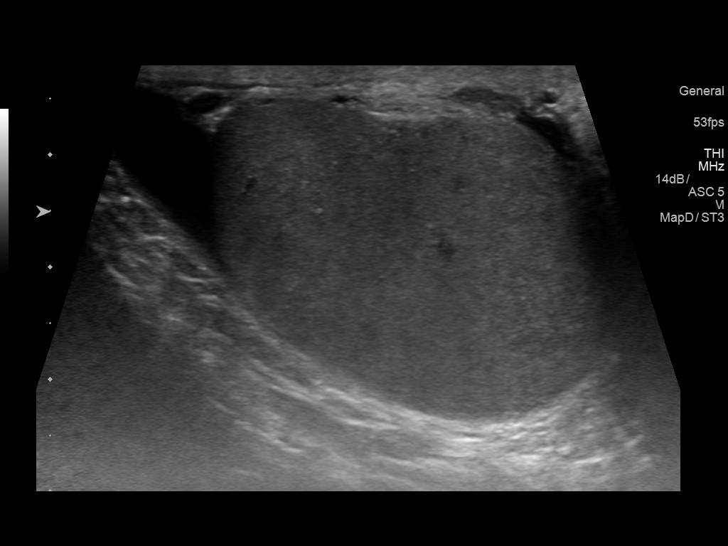
[im 28/111]
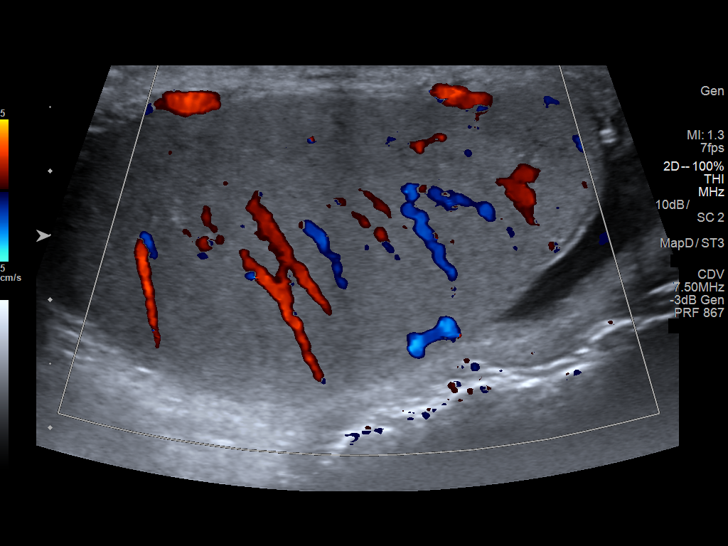
[im 37/111]
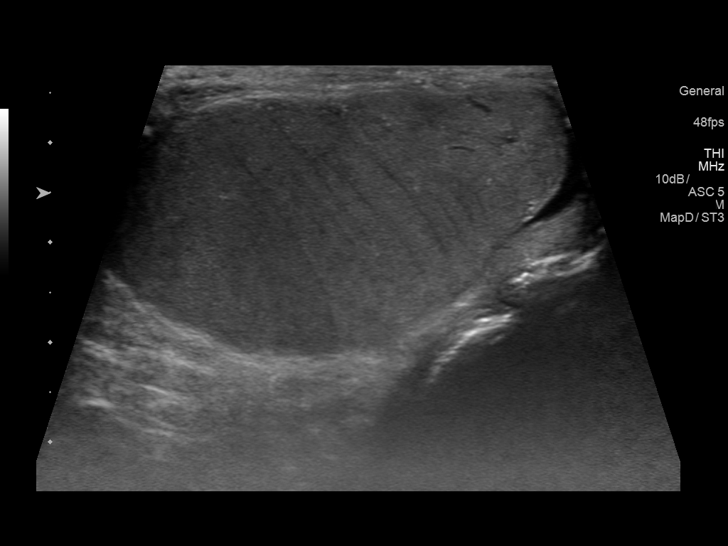
[im 46/111]
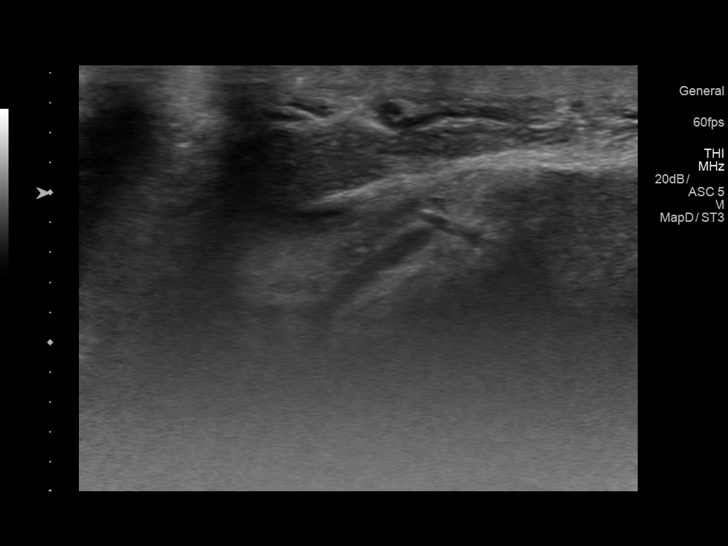
[im 56/111]
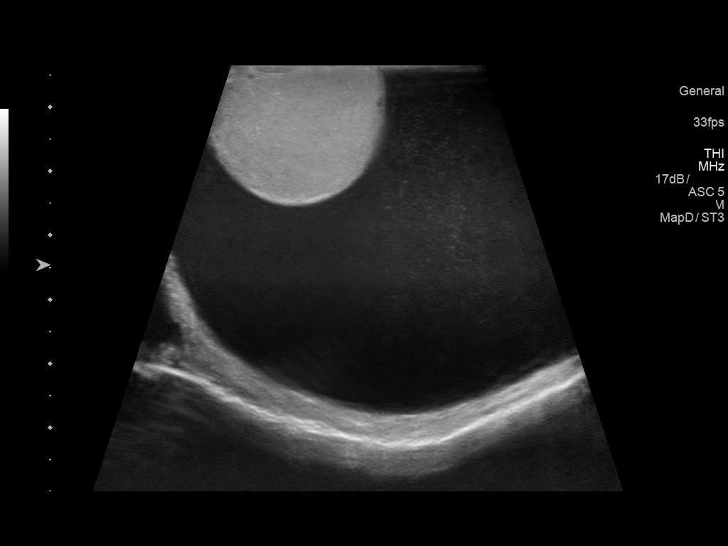
[im 65/111]
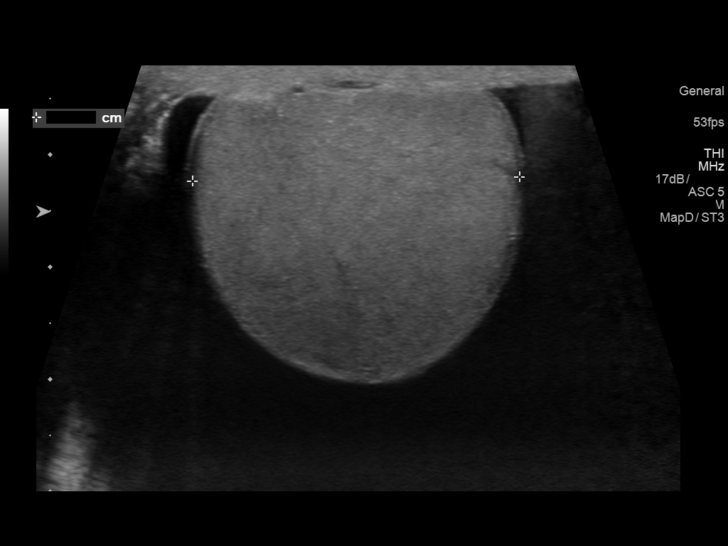
[im 74/111]
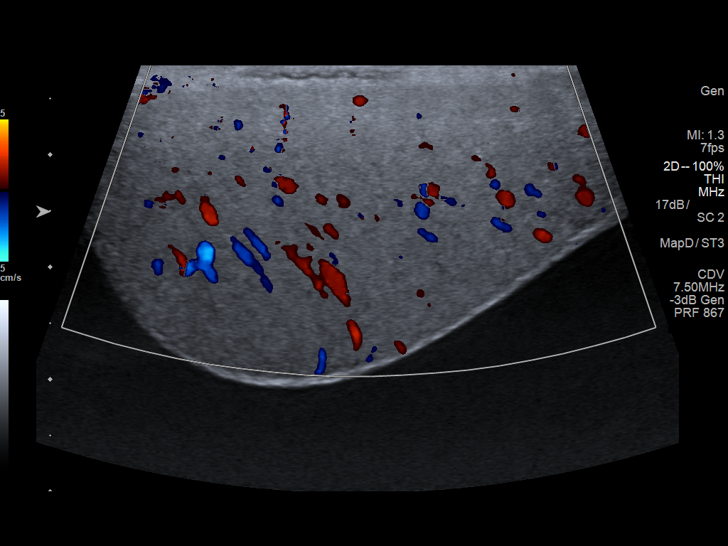
[im 83/111]
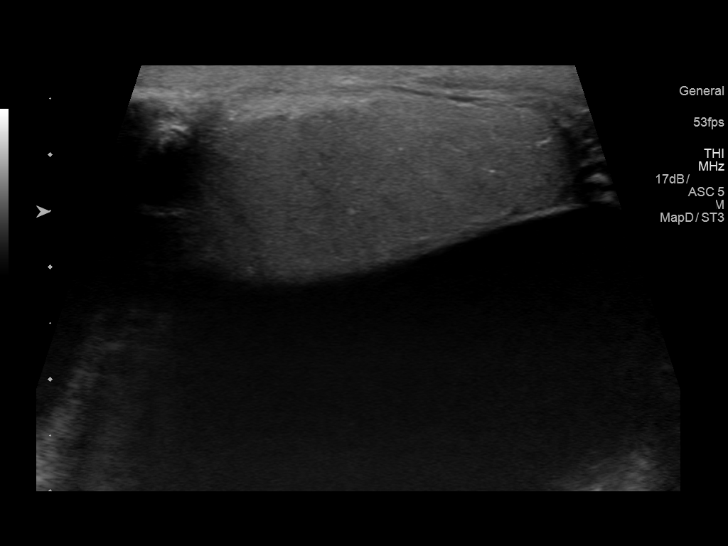
[im 92/111]
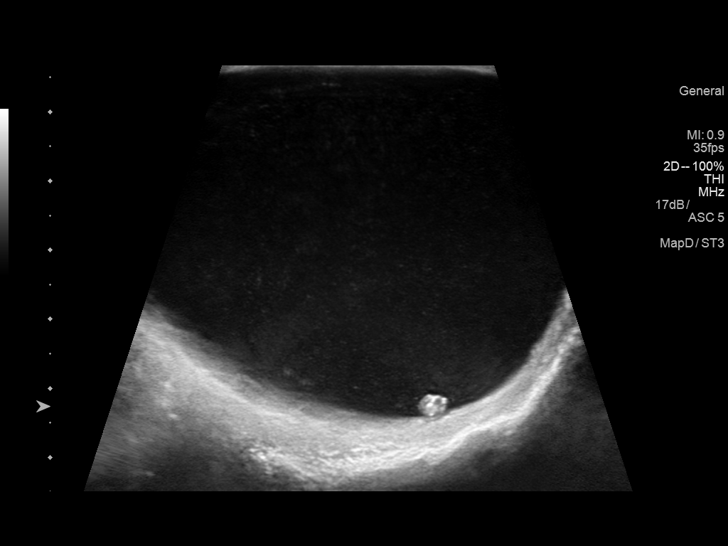
[im 101/111]
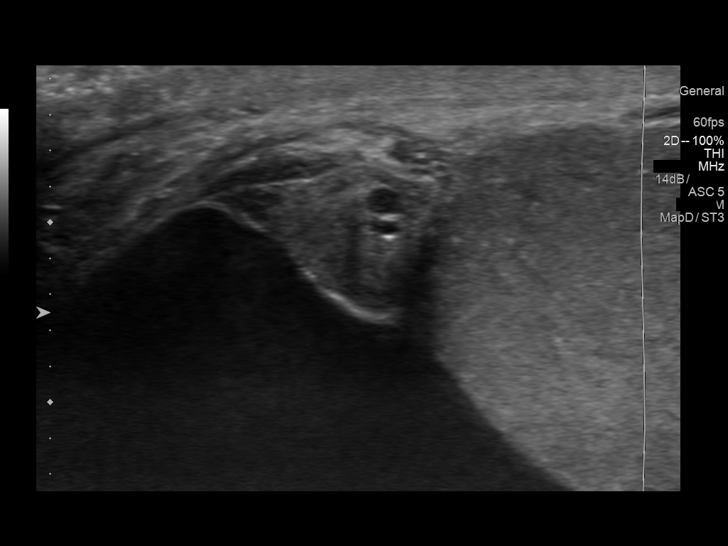
[im 111/111]
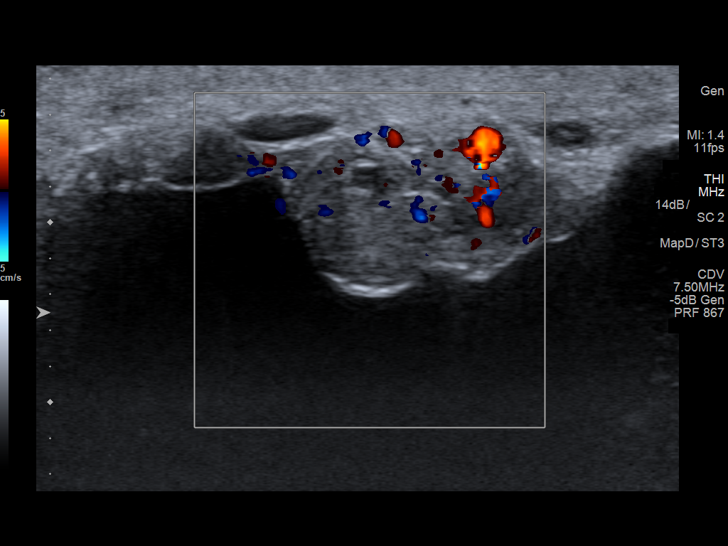

[13 of 25 positions shown; findings below may reference images not displayed]

FINDINGS: Right testicle

Measurements: 4.6 x 2.7 x 3.6 cm. The echotexture of the right
testicle is slightly heterogeneous. Solitary microcalcification
noted.

Left testicle

Measurements: 4.9 x 2.7 x 2.9 cm. No mass or microlithiasis
visualized.

Right epididymis:  Normal in size and appearance.

Left epididymis:  There is a small epididymal cyst measuring 2 mm.

Hydrocele: Bilateral hydroceles left greater than right. Within the
large left hydrocele there is floating debris. Small scrotal pearl
is noted within the dependent portion of the hydrocele.

Varicocele:  None visualized.
IMPRESSION: 1. No evidence for testicular torsion or testicular mass.
2. Large bilateral hydroceles, left greater than right.
3. Scrotal pearl noted within the within the left hemiscrotum.
4. Microcalcification within the right testis. Current literature
suggests that testicular microlithiasis is not a significant
independent risk factor for development of testicular carcinoma, and
that follow up imaging is not warranted in the absence of other risk
factors. Monthly testicular self-examination and annual physical
exams are considered appropriate surveillance. If patient has other
risk factors for testicular carcinoma, then referral to Urology
should be considered. (Reference: Nia, et al.: A 5-Year Follow
up Study of Asymptomatic Men with Testicular Microlithiasis. J Urol
9115; [phone].)

## 2017-04-13 ENCOUNTER — Encounter (HOSPITAL_COMMUNITY): Payer: Self-pay | Admitting: Emergency Medicine

## 2017-04-13 ENCOUNTER — Emergency Department (HOSPITAL_COMMUNITY)
Admission: EM | Admit: 2017-04-13 | Discharge: 2017-04-13 | Disposition: A | Discharge status: Home / Self Care | Payer: Self-pay | Attending: Emergency Medicine | Admitting: Emergency Medicine

## 2017-04-13 DIAGNOSIS — L0291 Cutaneous abscess, unspecified: Secondary | ICD-10-CM

## 2017-04-13 DIAGNOSIS — L02214 Cutaneous abscess of groin: Secondary | ICD-10-CM | POA: Insufficient documentation

## 2017-04-13 MED ORDER — SULFAMETHOXAZOLE-TRIMETHOPRIM 800-160 MG PO TABS: 1.0000 | ORAL_TABLET | Freq: Two times a day (BID) | ORAL | 0 refills | Status: AC

## 2017-04-13 MED ORDER — MUPIROCIN CALCIUM 2 % NA OINT: TOPICAL_OINTMENT | NASAL | 0 refills | Status: AC

## 2017-04-13 NOTE — ED Triage Notes (Addendum)
Pt reports small pinpoint site to suprapubic region. Pt reports pierced site with needle and reports small amount of discharge. Pt reports used antibiotic ointment but reports "its sealed off again" and reports intense pain and redness to site. Pt denies GI/GU symptoms.

## 2017-04-13 NOTE — Discharge Instructions (Signed)
Apply warm compresses or soaks 2-3 times a day.  Return here for any worsening symptoms

## 2017-04-13 NOTE — ED Provider Notes (Signed)
[facility]-EMERGENCY DEPT Provider Note   CSN: [id number] Arrival date & time: 04/13/17  1615     History   Chief Complaint Chief Complaint  Patient presents with  . Abscess    HPI Samuel Wright is a 41 y.o. male.  HPI   Samuel Wright is a 41 y.o. male who presents to the Emergency Department complaining of a localized area of redness, pain and induration for 3 days.  He states that he noticed a "pimple" to the left groin that he "stuck with a needle" and drained a small amt of blood and pus from it.  He admits that he has continued to squeeze the area and now reports increased redness and pain to the area.  He denies fever, chills, abd pain or difficulty urinating.     Past Medical History:  Diagnosis Date  . Anxiety     There are no active problems to display for this patient.   Past Surgical History:  Procedure Laterality Date  . HYDROCELE EXCISION Left 04/13/2016   Procedure: HYDROCELECTOMY ADULT;  Surgeon: Malen Gauze, MD;  Location: [facility];  Service: Urology;  Laterality: Left;       Home Medications    Prior to Admission medications   Medication Sig Start Date End Date Taking? Authorizing Provider  Multiple Vitamin (MULTIVITAMIN WITH MINERALS) TABS tablet Take 1 tablet by mouth daily.    [provider]  oxyCODONE-acetaminophen (ROXICET) 5-325 MG tablet Take 1 tablet by mouth every 4 (four) hours as needed for severe pain. 04/13/16   McKenzie, Mardene Celeste, MD  sulfamethoxazole-trimethoprim (BACTRIM DS,SEPTRA DS) 800-160 MG tablet Take 1 tablet by mouth 2 (two) times daily. 04/13/16   McKenzie, Mardene Celeste, MD    Family History History reviewed. No pertinent family history.  Social History Social History  Substance Use Topics  . Smoking status: Never Smoker  . Smokeless tobacco: Never Used  . Alcohol use No     Allergies   Patient has no known allergies.   Review of Systems Review of Systems  Constitutional: Negative for chills  and fever.  Gastrointestinal: Negative for nausea and vomiting.  Musculoskeletal: Negative for arthralgias and joint swelling.  Skin: Positive for color change.       Abscess   Hematological: Negative for adenopathy.  All other systems reviewed and are negative.    Physical Exam Updated Vital Signs BP (!) 136/92 (BP Location: Left Arm)   Pulse (!) 112   Temp 97.9 F (36.6 C) (Oral)   Resp 18   Ht 5\' 4"  (1.626 m)   Wt 86.2 kg (190 lb)   SpO2 94%   BMI 32.61 kg/m   Physical Exam  Constitutional: He is oriented to person, place, and time. He appears well-developed and well-nourished. No distress.  HENT:  Head: Normocephalic and atraumatic.  Mouth/Throat: Oropharynx is clear and moist.  Cardiovascular: Normal rate, regular rhythm and normal heart sounds.   No murmur heard. Pulmonary/Chest: Effort normal and breath sounds normal. No respiratory distress.  Abdominal: Soft. He exhibits no distension and no mass. There is no tenderness. There is no guarding.  Musculoskeletal: Normal range of motion.  Neurological: He is alert and oriented to person, place, and time. He exhibits normal muscle tone. Coordination normal.  Skin: Skin is warm and dry. There is erythema.  Quarter sized area of induration and erythema of the left groin.  Small adjacent area of bruising.  No fluctuance or drainage.   Nursing note and vitals  reviewed.    ED Treatments / Results  Labs (all labs ordered are listed, but only abnormal results are displayed) Labs Reviewed - No data to display  EKG  EKG Interpretation None       Radiology No results found.  Procedures Procedures (including critical care time)  Medications Ordered in ED Medications - No data to display   Initial Impression / Assessment and Plan / ED Course  I have reviewed the triage vital signs and the nursing notes.  Pertinent labs & imaging results that were available during my care of the patient were reviewed by me and  considered in my medical decision making (see chart for details).     Pt is well appearing, non-toxic.  Small area of induration which is likely related to inflammatory rxn and trauma.  Pt agrees to tx plan o warm soaks, bactroban and bactrim.  Return precautions discussed.   Final Clinical Impressions(s) / ED Diagnoses   Final diagnoses:  Abscess    New Prescriptions New Prescriptions   No medications on file     Pauline Ausriplett, Mansur Patti, Cordelia Poche-C 04/13/17 1713    Lavera GuiseLiu, Dana Duo, MD 04/13/17 2330
